# Patient Record
Sex: Male | Born: 1955 | Hispanic: Yes | Marital: Married | Smoking: Never smoker
Health system: Southern US, Community
[De-identification: ages and names within clinical notes are randomized; demographics above are authoritative.]

## PROBLEM LIST (undated history)

## (undated) DIAGNOSIS — M545 Low back pain, unspecified: Secondary | ICD-10-CM

## (undated) DIAGNOSIS — G8929 Other chronic pain: Secondary | ICD-10-CM

## (undated) DIAGNOSIS — I10 Essential (primary) hypertension: Secondary | ICD-10-CM

## (undated) DIAGNOSIS — M1990 Unspecified osteoarthritis, unspecified site: Secondary | ICD-10-CM

## (undated) HISTORY — PX: NO PAST SURGERIES: SHX2092

---

## 2006-07-06 ENCOUNTER — Ambulatory Visit: Payer: Self-pay

## 2007-06-04 ENCOUNTER — Ambulatory Visit: Payer: Self-pay | Admitting: Pain Medicine

## 2007-06-22 ENCOUNTER — Ambulatory Visit: Payer: Self-pay | Admitting: Pain Medicine

## 2007-06-25 ENCOUNTER — Ambulatory Visit: Payer: Self-pay | Admitting: Pain Medicine

## 2007-09-03 ENCOUNTER — Ambulatory Visit: Payer: Self-pay | Admitting: Pain Medicine

## 2007-09-13 ENCOUNTER — Ambulatory Visit: Payer: Self-pay | Admitting: Pain Medicine

## 2007-10-15 ENCOUNTER — Ambulatory Visit: Payer: Self-pay | Admitting: Pain Medicine

## 2007-10-23 ENCOUNTER — Ambulatory Visit: Payer: Self-pay | Admitting: Pain Medicine

## 2007-11-07 ENCOUNTER — Ambulatory Visit: Payer: Self-pay | Admitting: Pain Medicine

## 2007-11-13 ENCOUNTER — Ambulatory Visit: Payer: Self-pay | Admitting: Pain Medicine

## 2007-11-28 ENCOUNTER — Ambulatory Visit: Payer: Self-pay | Admitting: Pain Medicine

## 2007-12-19 ENCOUNTER — Ambulatory Visit: Payer: Self-pay | Admitting: Pain Medicine

## 2007-12-31 ENCOUNTER — Ambulatory Visit: Payer: Self-pay | Admitting: Pain Medicine

## 2008-01-30 ENCOUNTER — Ambulatory Visit: Payer: Self-pay | Admitting: Pain Medicine

## 2008-03-10 ENCOUNTER — Ambulatory Visit: Payer: Self-pay | Admitting: Pain Medicine

## 2008-03-19 ENCOUNTER — Ambulatory Visit: Payer: Self-pay | Admitting: Pain Medicine

## 2008-04-07 ENCOUNTER — Ambulatory Visit: Payer: Self-pay | Admitting: Pain Medicine

## 2008-05-07 ENCOUNTER — Ambulatory Visit: Payer: Self-pay | Admitting: Physician Assistant

## 2008-06-05 ENCOUNTER — Ambulatory Visit: Payer: Self-pay | Admitting: Physician Assistant

## 2008-07-02 ENCOUNTER — Ambulatory Visit: Payer: Self-pay | Admitting: Pain Medicine

## 2008-09-10 ENCOUNTER — Ambulatory Visit: Payer: Self-pay | Admitting: Pain Medicine

## 2008-09-16 ENCOUNTER — Ambulatory Visit: Payer: Self-pay | Admitting: Pain Medicine

## 2008-10-06 ENCOUNTER — Ambulatory Visit: Payer: Self-pay | Admitting: Pain Medicine

## 2008-10-30 ENCOUNTER — Emergency Department: Payer: Self-pay | Admitting: Emergency Medicine

## 2008-12-29 ENCOUNTER — Ambulatory Visit: Payer: Self-pay | Admitting: Pain Medicine

## 2009-03-30 ENCOUNTER — Ambulatory Visit: Payer: Self-pay | Admitting: Pain Medicine

## 2009-06-29 ENCOUNTER — Ambulatory Visit: Payer: Self-pay | Admitting: Pain Medicine

## 2009-09-21 ENCOUNTER — Ambulatory Visit: Payer: Self-pay | Admitting: Pain Medicine

## 2009-12-12 IMAGING — CT CT ABD-PELV W/ CM
1 of 3 series · 12 of 32 positions shown, 18 images · non-contrast
Comparison: none

REASON FOR EXAM: (1) severe epigastric and umbilical pain with fever; (2)
tenderness and fever
COMMENTS:   LMP: (Male)

[Series 2: abdomen · axial · 0.87mm/px · z∈[-40,+410]mm · 12 of 106 slices shown, 18 images]
[im 8/106  soft-tissue]
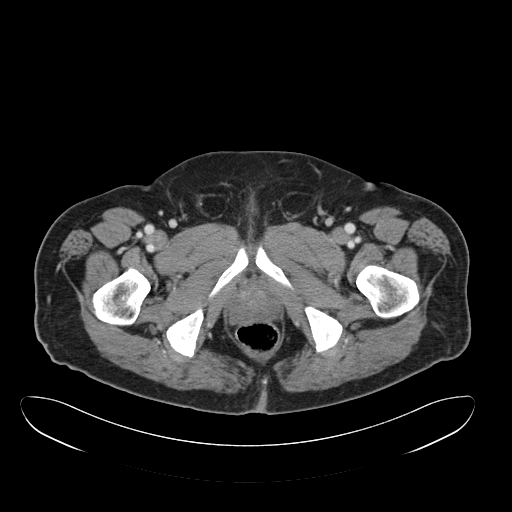
[im 8/106  bone]
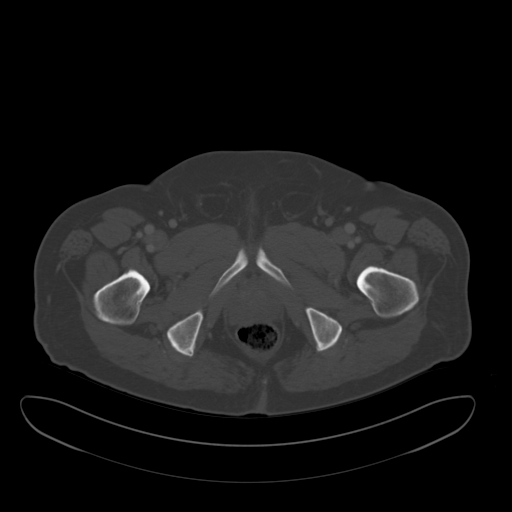
[im 16/106  soft-tissue]
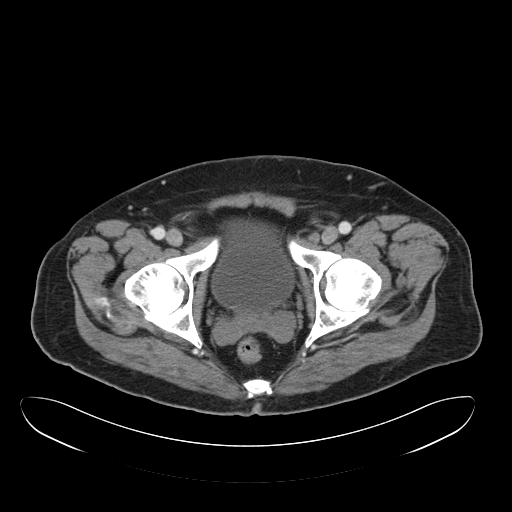
[im 23/106  soft-tissue]
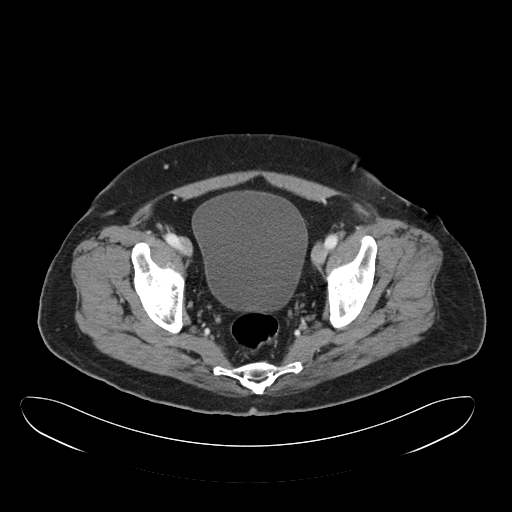
[im 31/106  soft-tissue]
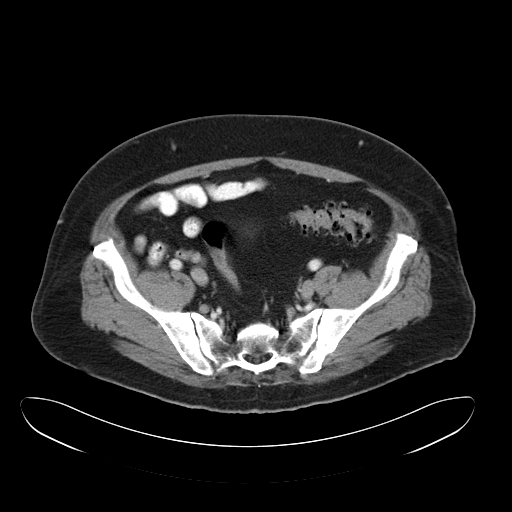
[im 38/106  soft-tissue]
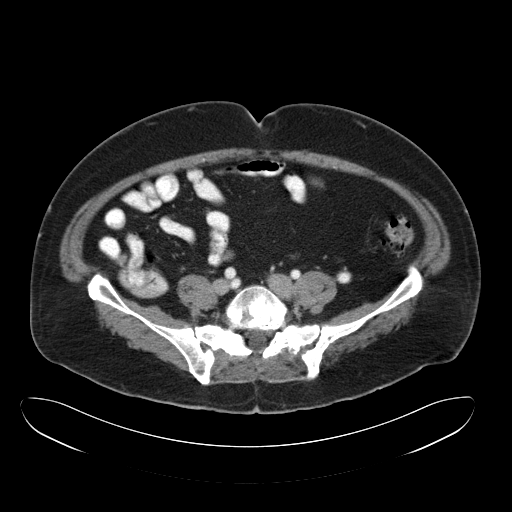
[im 46/106  soft-tissue]
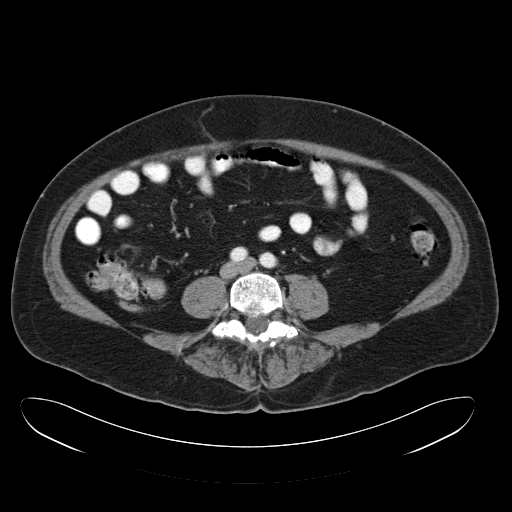
[im 61/106  soft-tissue]
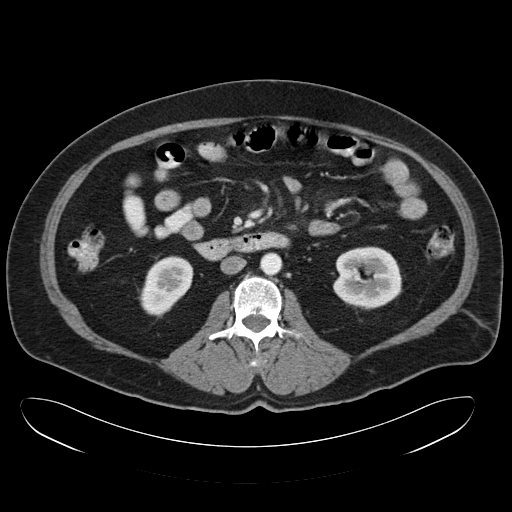
[im 68/106  soft-tissue]
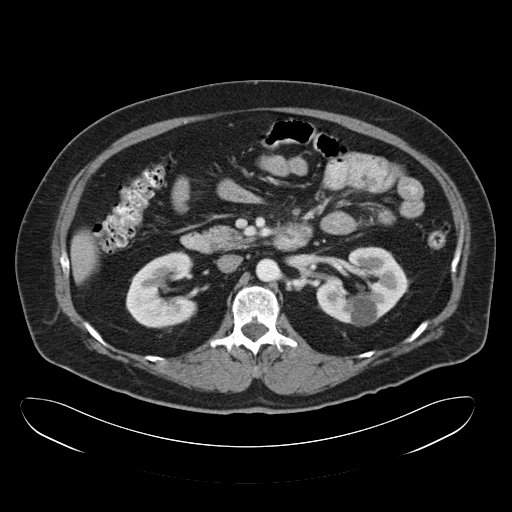
[im 76/106  soft-tissue]
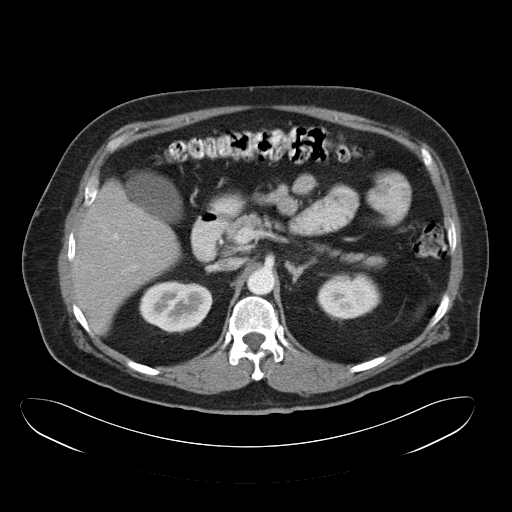
[im 76/106  lung]
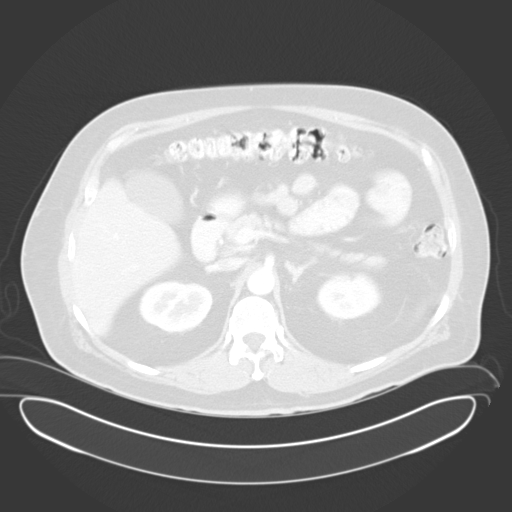
[im 76/106  bone]
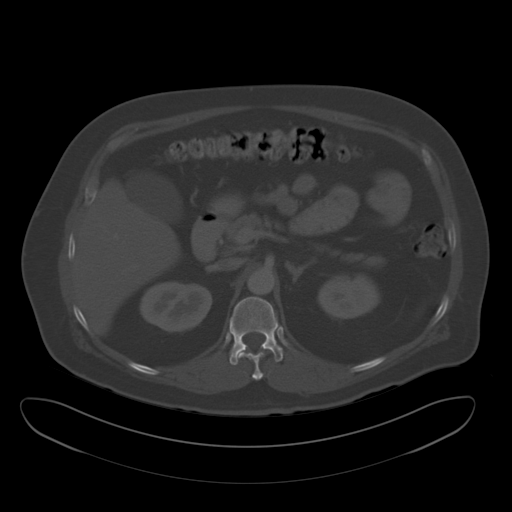
[im 83/106  soft-tissue]
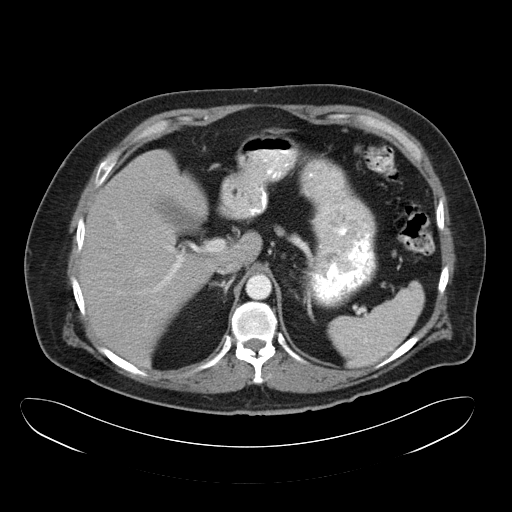
[im 83/106  lung]
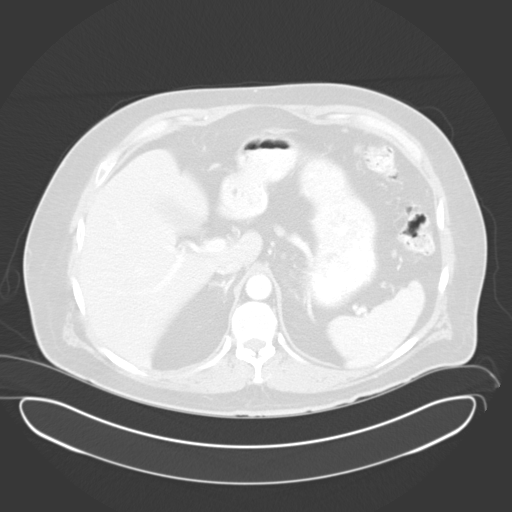
[im 91/106  soft-tissue]
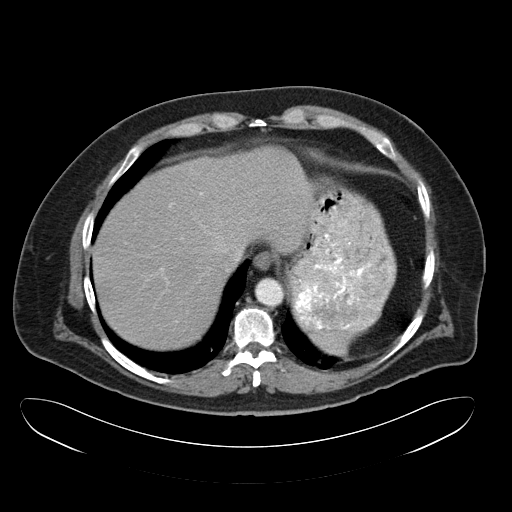
[im 91/106  lung]
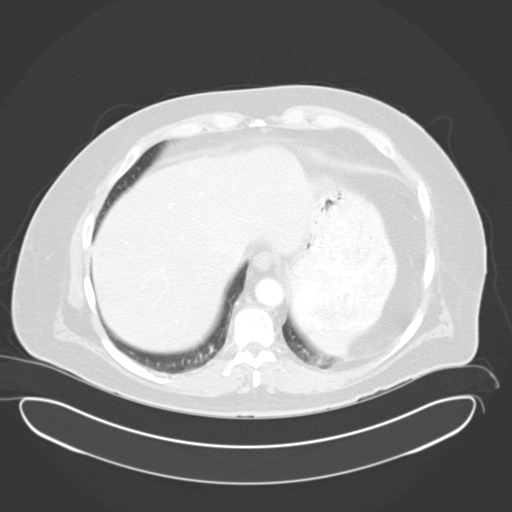
[im 98/106  soft-tissue]
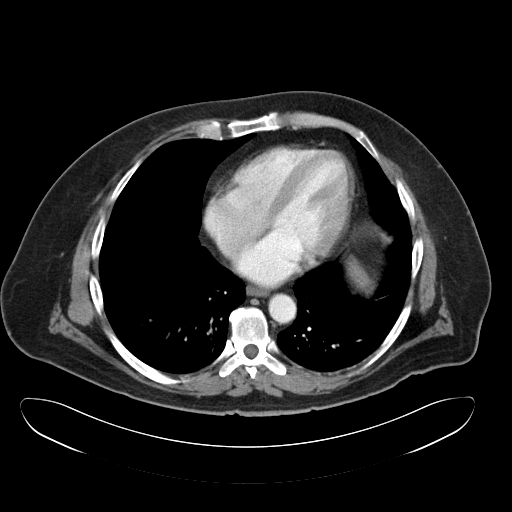
[im 98/106  lung]
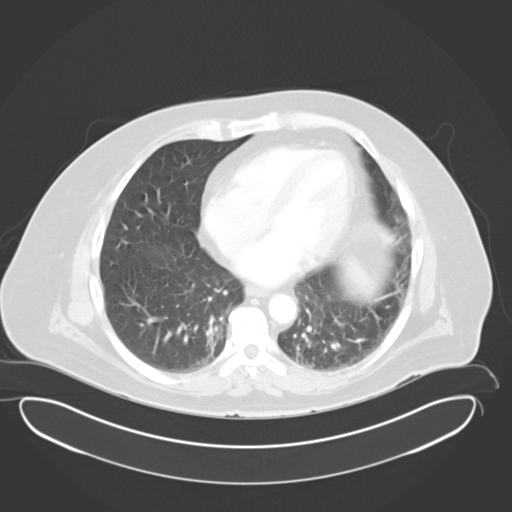

[12 of 32 positions shown; findings below may reference images not displayed]

PROCEDURE:     CT  - CT ABDOMEN / PELVIS  W  - October 31, 2008  [DATE]

RESULT:     Axial CT scanning was performed through the abdomen and pelvis
at 5 mm intervals and slice thicknesses following administration of 100 cc
of Lsovue-BFV as well as oral contrast material. Review of 3-dimensional
reconstructed images was performed separately on the [employer]
monitor. Immediate and delayed images were obtained.

The stomach is moderately distended with food and a small amount of oral
contrast. The small and large bowel contain a moderate amount of the orally
administered contrast but the contrast has not reached the left colon. There
is sigmoid diverticulosis. In the appropriate clinical setting there could
be an element of low-grade diverticulitis. There is no evidence of
obstruction or diverticular abscess or perforation. The urinary bladder is
partially distended and grossly normal. The prostate gland is mildly
enlarged and produces an impression upon the urinary bladder base.

The liver, gallbladder, pancreas, adrenal glands, and spleen are normal in
appearance. There is approximately 2 by 3 cm diameter hypodensity in the
midpole of the left kidney posteriorly most compatible with a benign cyst.
The caliber of the abdominal aorta is normal. The appendix is demonstrated
and it is normal in appearance. The lung bases exhibit very minimal
subsegmental atelectasis posterior medially in the lower lobes. The lumbar
vertebral bodies are preserved in height.
IMPRESSION: 1. There is sigmoid diverticulosis. I cannot exclude low-grade
diverticulitis in the appropriate clinical setting but no significant
perisigmoid inflammatory changes are seen. There is no evidence of an
abscess or perforation.
2. There is no evidence of abnormality elsewhere involving the bowel. The
appendix is demonstrated and appears normal.
3. I see no acute hepatobiliary abnormality nor acute urinary tract
abnormality. A cyst in the left kidney is suspected there is mild
enlargement of the prostate gland.

A preliminary report was sent to the [HOSPITAL] the conclusion
of the study.

## 2009-12-12 IMAGING — US ABDOMEN ULTRASOUND
1 series · 17 of 25 positions shown · non-contrast
Comparison: none

REASON FOR EXAM: epigastric umbilical and right upper quadrant pain
COMMENTS:

[Series 1: abdomen ultrasound · 17 of 57 slices shown]
[im 1/57]
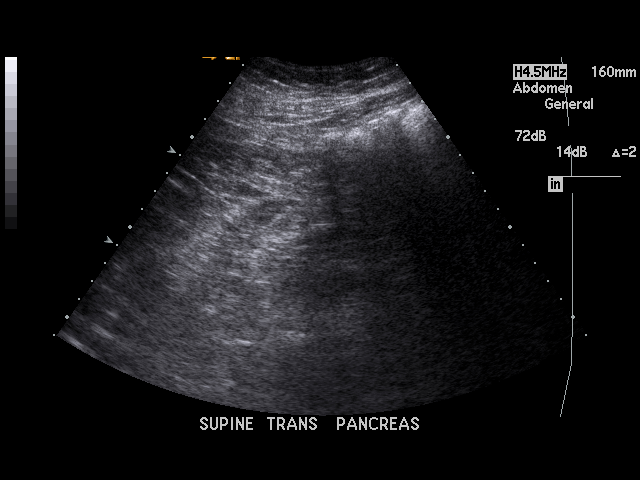
[im 5/57]
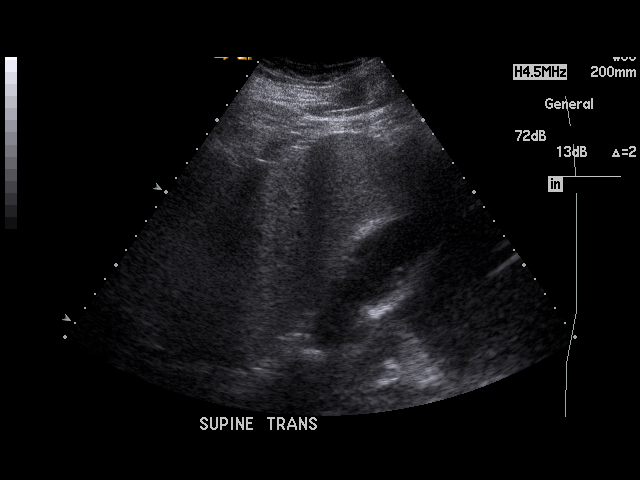
[im 8/57]
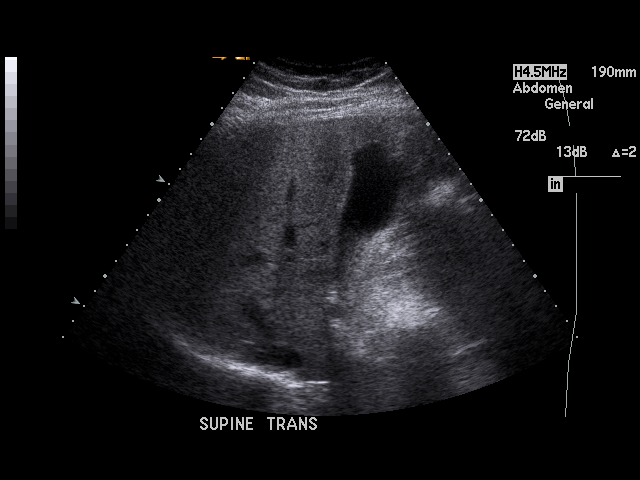
[im 12/57]
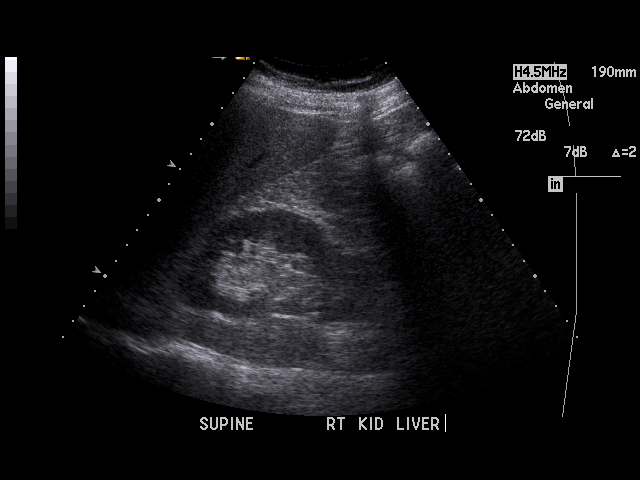
[im 15/57]
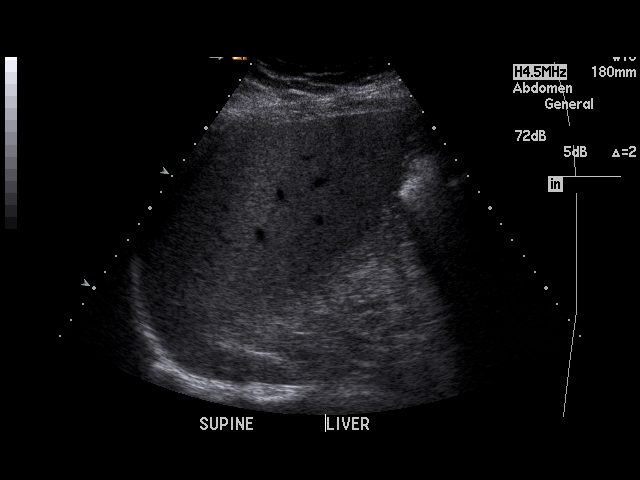
[im 19/57]
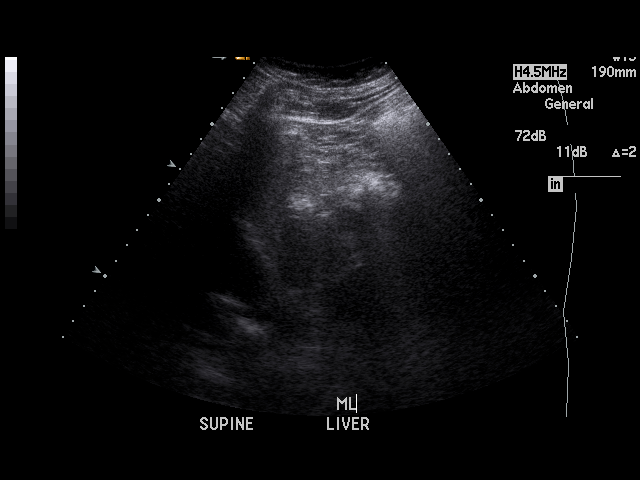
[im 22/57]
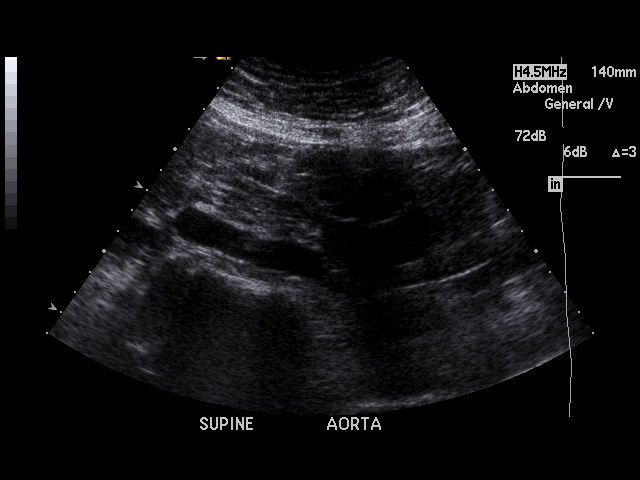
[im 26/57]
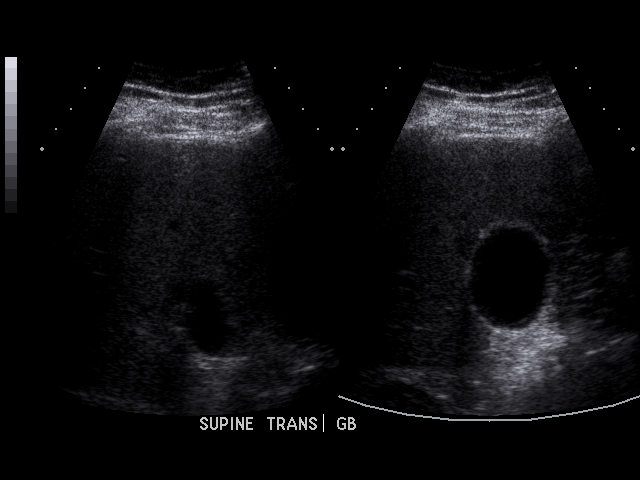
[im 29/57]
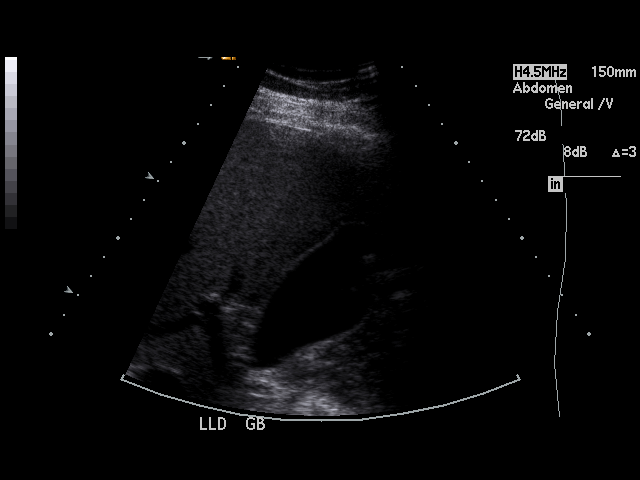
[im 31/57]
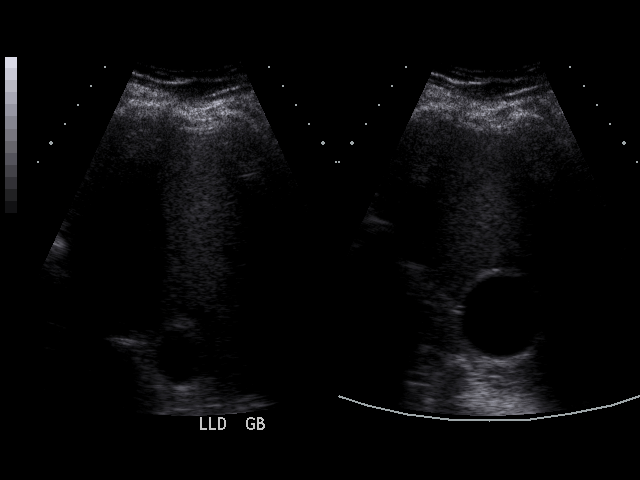
[im 36/57]
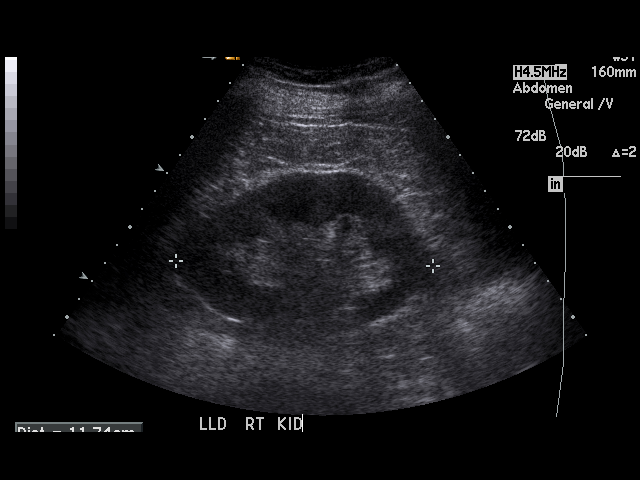
[im 38/57]
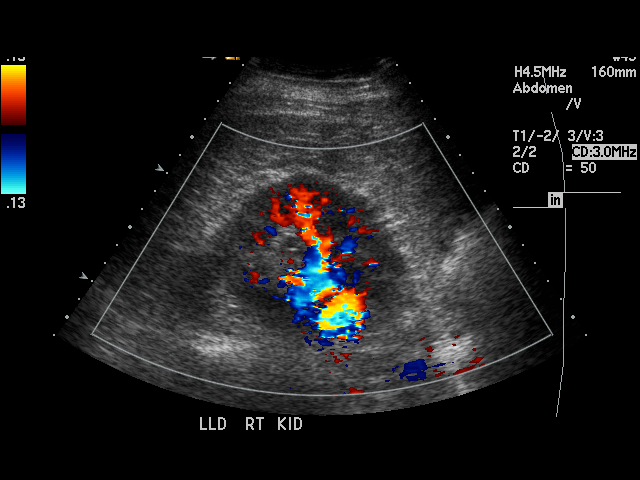
[im 43/57]
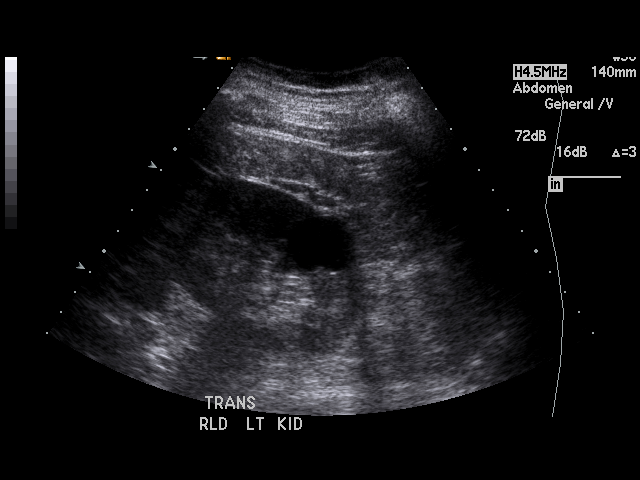
[im 45/57]
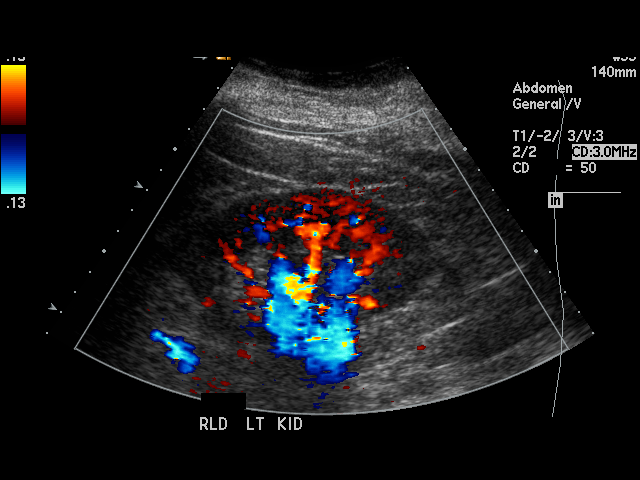
[im 50/57]
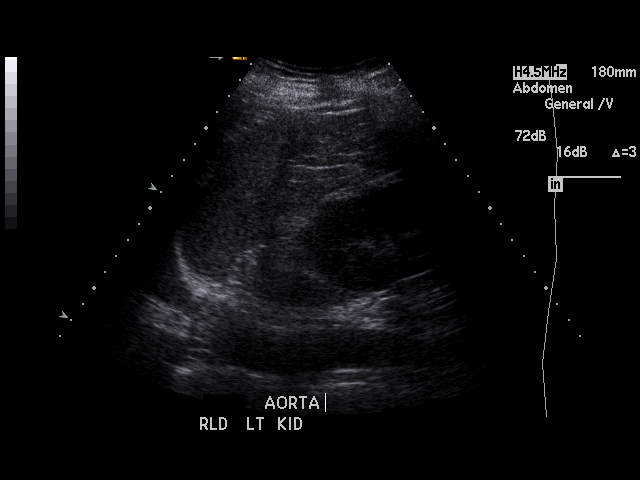
[im 52/57]
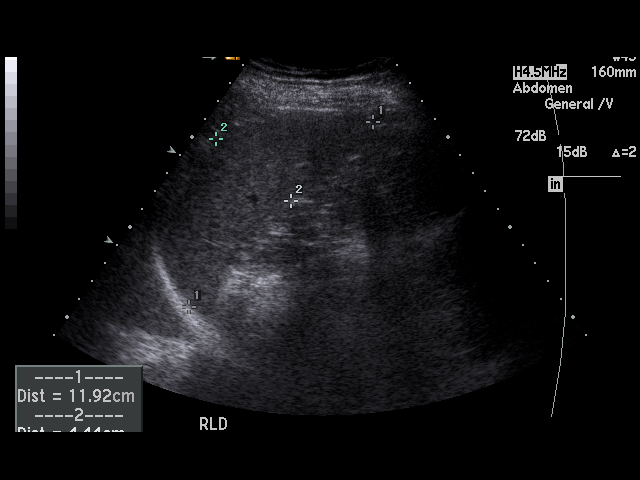
[im 57/57]
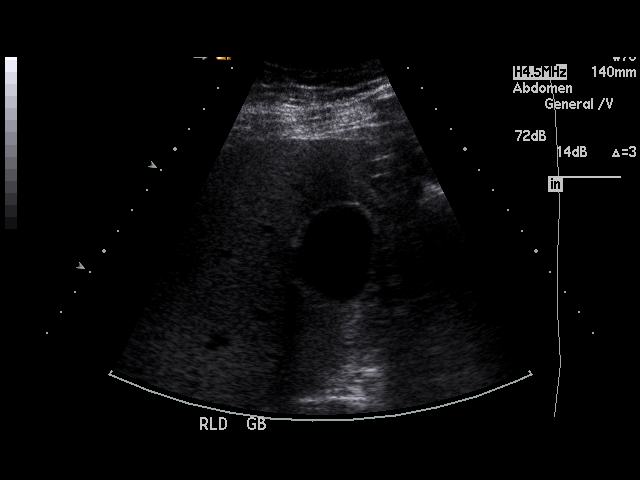

[17 of 25 positions shown; findings below may reference images not displayed]

PROCEDURE:     US  - US ABDOMEN GENERAL SURVEY  - October 31, 2008  [DATE]

RESULT:     The liver exhibits normal echotexture with no evidence of a mass
nor ductal dilation. Portal venous flow is normal in direction toward the
liver. The pancreas could not be demonstrated due to the presence of bowel
gas. The gallbladder is adequately distended with no evidence of stones,
wall thickening, or pericholecystic fluid. There is no sonographic Murphy's
sign elicited but the patient had received pain medication. The common bile
duct measures 3.1 mm in diameter. The spleen, abdominal aorta, and kidneys
exhibit no acute abnormality. In the upper pole of the right correction left
kidney there is an approximately 2.8 cm diameter simple appearing cyst.
IMPRESSION: I do not see acute intra-abdominal abnormality on this
study. Specifically no gallstones are demonstrated. A preliminary report was
sent to the [HOSPITAL] the conclusion of the study.

## 2010-07-30 ENCOUNTER — Emergency Department: Payer: Self-pay | Admitting: Emergency Medicine

## 2018-08-27 ENCOUNTER — Other Ambulatory Visit: Payer: Self-pay

## 2018-08-27 DIAGNOSIS — M25562 Pain in left knee: Secondary | ICD-10-CM | POA: Diagnosis not present

## 2018-08-27 NOTE — ED Triage Notes (Signed)
Pt in with co left knee pain hx of arthritis, has had injections to same knee in the past. Denies any new injury.

## 2018-08-28 ENCOUNTER — Encounter: Payer: Self-pay | Admitting: Emergency Medicine

## 2018-08-28 ENCOUNTER — Emergency Department
Admission: EM | Admit: 2018-08-28 | Discharge: 2018-08-28 | Disposition: A | Discharge status: Home / Self Care | Payer: Medicaid Other | Attending: Emergency Medicine | Admitting: Emergency Medicine

## 2018-08-28 DIAGNOSIS — M25562 Pain in left knee: Secondary | ICD-10-CM

## 2018-08-28 HISTORY — DX: Other chronic pain: G89.29

## 2018-08-28 HISTORY — DX: Unspecified osteoarthritis, unspecified site: M19.90

## 2018-08-28 HISTORY — DX: Low back pain: M54.5

## 2018-08-28 HISTORY — DX: Low back pain, unspecified: M54.50

## 2018-08-28 MED ORDER — LIDOCAINE 5 % EX PTCH
1.0000 | MEDICATED_PATCH | CUTANEOUS | Status: DC
  Administered 2018-08-28: 1 via TRANSDERMAL
  Filled 2018-08-28: qty 1

## 2018-08-28 MED ORDER — LIDOCAINE 5 % EX PTCH: 1.0000 | MEDICATED_PATCH | Freq: Two times a day (BID) | CUTANEOUS | 0 refills | Status: AC

## 2018-08-28 NOTE — ED Provider Notes (Signed)
[facility] Emergency Department Provider Note  ____________________________________________   First MD Initiated Contact with Patient 08/28/18 0123     (approximate)  I have reviewed the triage vital signs and the nursing notes.   HISTORY  Chief Complaint Knee Pain  The patient and/or family speak(s) Spanish.  They understand they have the right to the use of a hospital interpreter, however at this time they prefer to speak directly with me in Spanish.  They know that they can ask for an interpreter at any time.  His wife also speaks [place] and they prefer to interact with me directly rather than through a teleinterpreter.   HPI Bryan Novak is a 63 y.o. male with chronic pain as listed below who presents for evaluation of gradually worsening pain in his left knee for the last 3 weeks.  He reports that he has a lot of nerve pain in his back but also down his leg and around the knee.  He says that he had a fall about 3 weeks ago and he is sure that he did not break anything but the pain is gradually gotten worse.  He is ambulatory but it hurts to do so and he has trouble bearing weight.  There is no swelling or redness.  He says that he needs something for the pain and preferably an injection because he had a similar problem years ago and his knee and felt much better after getting an injection.  He does not have an orthopedic doctor and has not seen an orthopedic doctor since the subacute injury.  He sees a pain management doctor at [facility] regularly and has been taking his medications.  Nothing in particular makes these symptoms better and weightbearing makes it worse.  He has chronic pain in his lumbar spine radiating down his legs and that has not changed.  Past Medical History:  Diagnosis Date  . Arthritis   . Chronic lumbar pain    sees [facility]    There are no active problems to display for this patient.   History reviewed. No pertinent  surgical history.  Prior to Admission medications   Medication Sig Start Date End Date Taking? Authorizing Provider  lidocaine (LIDODERM) 5 % Place 1 patch onto the skin every 12 (twelve) hours. Remove & Discard patch within 12 hours or as directed by MD.  [name] the patch off for 12 hours before applying a new one. 08/28/18 08/28/19  Loleta Rose, MD    Allergies Patient has no allergy information on record.  History reviewed. No pertinent family history.  Social History Social History   Tobacco Use  . Smoking status: Never Smoker  . Smokeless tobacco: Never Used  Substance Use Topics  . Alcohol use: Not on file  . Drug use: Not on file    Review of Systems Constitutional: No fever/chills Cardiovascular: Denies chest pain. Respiratory: Denies shortness of breath. Gastrointestinal: No abdominal pain.  No nausea, no vomiting.   Musculoskeletal: Chronic low back pain.  Worsening pain for the last 3 weeks in the left knee but with a history of bilateral chronic knee pain secondary to osteoarthritis. Integumentary: Negative for rash, redness, nor swelling. Neurological: Negative for headaches, focal weakness or numbness.   ____________________________________________   PHYSICAL EXAM:  VITAL SIGNS: ED Triage Vitals [08/27/18 2337]  Enc Vitals Group     BP (!) 149/89     Pulse Rate 72     Resp 20     Temp  97.6 F (36.4 C)     Temp Source Oral     SpO2 97 %     Weight 97.1 kg (214 lb)     Height      Head Circumference      Peak Flow      Pain Score 10     Pain Loc      Pain Edu?      Excl. in GC?     Constitutional: Alert and oriented.  Generally well-appearing and not in distress. Eyes: Conjunctivae are normal.  Head: Atraumatic. Cardiovascular: Normal rate, regular rhythm. Good peripheral circulation. Respiratory: Normal respiratory effort.  No retractions.  Musculoskeletal: No knee effusion on either side.  Normal appearance of the left knee.  No joint laxity  with Lachman test.  No reported pain or tenderness with flexion and extension of the knee.  The patient does have some tenderness to palpation of the lateral side of the knee but without any palpable effusion or swelling. Neurologic:  Normal speech and language. No gross focal neurologic deficits are appreciated.  Skin:  Skin is warm, dry and intact. No rash noted including no erythema of the left knee. Psychiatric: Mood and affect are normal. Speech and behavior are normal.  ____________________________________________   LABS (all labs ordered are listed, but only abnormal results are displayed)  Labs Reviewed - No data to display ____________________________________________  EKG  No indication for EKG ____________________________________________  RADIOLOGY   ED MD interpretation: Patient refuses radiographs.  Official radiology report(s): No results found.  ____________________________________________   PROCEDURES  Critical Care performed: No   Procedure(s) performed:   Procedures   ____________________________________________   INITIAL IMPRESSION / ASSESSMENT AND PLAN / ED COURSE  As part of my medical decision making, I reviewed the following data within the electronic MEDICAL RECORD NUMBER History obtained from family, Nursing notes reviewed and incorporated, Old chart reviewed, Notes from prior ED visits and [place] Controlled Substance Database    The patient has a history of osteoarthritis which I suspect is the main cause of the pain in his knee.  He has had pain in the left knee for 3 weeks and reports it is getting worse.  There is no physical abnormality appreciated on exam and he has some tenderness to the lateral aspect which could indicate a ligamentous injury that is subacute at this point.  The patient is very insistent that he wants an injection in his knee, but I explained several times that not only do I not perform that procedure in the emergency department,  I am not certain that that it would be beneficial to him given the possibility of a ligamentous injury.  I also explained that I strongly feel that given that this is not a procedure with which I am comfortable, we do not have a specific diagnosis, and that injecting medication such as lidocaine or steroids could actually worsen his condition.  He is unhappy with this but he and his wife say that they understand.  I explained that they need to follow-up with orthopedics but I also provided information for Dr. Yves Dill with physiatry who may be able to assist.  I repeatedly offered to obtain plain radiographs to make sure there is no bony injury but the patient refuses.  I am providing a knee immobilizer for stabilization and comfort as well as a Lidoderm patch and to the nurses instructed him and his spouse regarding the use (12 hours on and 12 hours off).  I  encouraged him to use his regularly prescribed narcotics and to follow-up with his doctor and orthopedics or physiatry as soon as possible.  They state they understand and agree.  There is no evidence at all that this represents a septic joint or other emergent medical condition.     ____________________________________________  FINAL CLINICAL IMPRESSION(S) / ED DIAGNOSES  Final diagnoses:  Left knee pain, unspecified chronicity     MEDICATIONS GIVEN DURING THIS VISIT:  Medications  lidocaine (LIDODERM) 5 % 1 patch (1 patch Transdermal Patch Applied 08/28/18 0144)     ED Discharge Orders         Ordered    lidocaine (LIDODERM) 5 %  Every 12 hours     08/28/18 0149           Note:  This document was prepared using Dragon voice recognition software and may include unintentional dictation errors.   Loleta RoseForbach, Bridey Brookover, MD 08/28/18 [phone]

## 2018-08-28 NOTE — Discharge Instructions (Addendum)
It is important that you follow-up with orthopedics or Dr. Yves Dill who is another kind of doctor that can help with joint and musculoskeletal injections.  Please call either 1 of the doctors listed to schedule the next available follow-up appointment.  Orthopedics clinic also has a walk-in clinic where an appointment is not necessary and you can go directly there during business hours tomorrow.  Use the prescribed Lidoderm patch as needed but remember to remove it after 12 hours and give yourself 12 hours without the patch.  Use the knee immobilizer for stability and comfort.  Read through the included information for additional management recommendations.

## 2020-10-26 ENCOUNTER — Other Ambulatory Visit: Payer: Self-pay

## 2020-10-26 ENCOUNTER — Encounter: Payer: Self-pay | Admitting: Ophthalmology

## 2020-10-27 ENCOUNTER — Other Ambulatory Visit: Admission: RE | Admit: 2020-10-27 | Payer: 59 | Source: Ambulatory Visit

## 2020-10-27 NOTE — Discharge Instructions (Signed)

## 2020-10-29 ENCOUNTER — Ambulatory Visit
Admission: RE | Admit: 2020-10-29 | Discharge: 2020-10-29 | Disposition: A | Discharge status: Home / Self Care | Payer: 59 | Attending: Ophthalmology | Admitting: Ophthalmology

## 2020-10-29 ENCOUNTER — Encounter
Admission: RE | Disposition: A | Discharge status: Home / Self Care | Payer: Self-pay | Source: Home / Self Care | Attending: Ophthalmology

## 2020-10-29 ENCOUNTER — Other Ambulatory Visit: Payer: Self-pay

## 2020-10-29 ENCOUNTER — Encounter: Payer: Self-pay | Admitting: Ophthalmology

## 2020-10-29 ENCOUNTER — Ambulatory Visit: Payer: 59 | Admitting: Anesthesiology

## 2020-10-29 DIAGNOSIS — H2511 Age-related nuclear cataract, right eye: Secondary | ICD-10-CM | POA: Diagnosis present

## 2020-10-29 DIAGNOSIS — Z79899 Other long term (current) drug therapy: Secondary | ICD-10-CM | POA: Diagnosis not present

## 2020-10-29 HISTORY — PX: CATARACT EXTRACTION W/PHACO: SHX586

## 2020-10-29 HISTORY — DX: Essential (primary) hypertension: I10

## 2020-10-29 HISTORY — DX: Other chronic pain: G89.29

## 2020-10-29 SURGERY — PHACOEMULSIFICATION, CATARACT, WITH IOL INSERTION
Anesthesia: Monitor Anesthesia Care | Site: Eye | Laterality: Right

## 2020-10-29 MED ORDER — MOXIFLOXACIN HCL 0.5 % OP SOLN
OPHTHALMIC | Status: DC | PRN
  Administered 2020-10-29: 0.2 mL via OPHTHALMIC

## 2020-10-29 MED ORDER — FENTANYL CITRATE (PF) 100 MCG/2ML IJ SOLN
INTRAMUSCULAR | Status: DC | PRN
  Administered 2020-10-29: 50 ug via INTRAVENOUS

## 2020-10-29 MED ORDER — EPINEPHRINE PF 1 MG/ML IJ SOLN
INTRAOCULAR | Status: DC | PRN
  Administered 2020-10-29: 54 mL via OPHTHALMIC

## 2020-10-29 MED ORDER — MIDAZOLAM HCL 2 MG/2ML IJ SOLN
INTRAMUSCULAR | Status: DC | PRN
  Administered 2020-10-29: 1 mg via INTRAVENOUS

## 2020-10-29 MED ORDER — BRIMONIDINE TARTRATE-TIMOLOL 0.2-0.5 % OP SOLN
OPHTHALMIC | Status: DC | PRN
  Administered 2020-10-29: 1 [drp] via OPHTHALMIC

## 2020-10-29 MED ORDER — LIDOCAINE HCL (PF) 2 % IJ SOLN
INTRAOCULAR | Status: DC | PRN
  Administered 2020-10-29: 1 mL

## 2020-10-29 MED ORDER — ARMC OPHTHALMIC DILATING DROPS
1.0000 "application " | OPHTHALMIC | Status: DC | PRN
  Administered 2020-10-29 (×3): 1 via OPHTHALMIC

## 2020-10-29 MED ORDER — NA CHONDROIT SULF-NA HYALURON 40-17 MG/ML IO SOLN
INTRAOCULAR | Status: DC | PRN
  Administered 2020-10-29: 1 mL via INTRAOCULAR

## 2020-10-29 MED ORDER — TETRACAINE HCL 0.5 % OP SOLN
1.0000 [drp] | OPHTHALMIC | Status: DC | PRN
  Administered 2020-10-29 (×3): 1 [drp] via OPHTHALMIC

## 2020-10-29 SURGICAL SUPPLY — 15 items
CANNULA ANT/CHMB 27GA (MISCELLANEOUS) ×4 IMPLANT
GLOVE SURG TRIUMPH 8.0 PF LTX (GLOVE) ×2 IMPLANT
GOWN STRL REUS W/ TWL LRG LVL3 (GOWN DISPOSABLE) ×2 IMPLANT
GOWN STRL REUS W/TWL LRG LVL3 (GOWN DISPOSABLE) ×4
LENS IOL TECNIS EYHANCE 21.5 (Intraocular Lens) ×2 IMPLANT
MARKER SKIN DUAL TIP RULER LAB (MISCELLANEOUS) ×2 IMPLANT
NEEDLE FILTER BLUNT 18X 1/2SAF (NEEDLE) ×1
NEEDLE FILTER BLUNT 18X1 1/2 (NEEDLE) ×1 IMPLANT
PACK EYE AFTER SURG (MISCELLANEOUS) ×2 IMPLANT
PACK OPTHALMIC (MISCELLANEOUS) ×2 IMPLANT
PACK PORFILIO (MISCELLANEOUS) ×2 IMPLANT
SYR 3ML LL SCALE MARK (SYRINGE) ×2 IMPLANT
SYR TB 1ML LUER SLIP (SYRINGE) ×2 IMPLANT
WATER STERILE IRR 250ML POUR (IV SOLUTION) ×2 IMPLANT
WIPE NON LINTING 3.25X3.25 (MISCELLANEOUS) ×2 IMPLANT

## 2020-10-29 NOTE — Anesthesia Procedure Notes (Signed)
Procedure Name: MAC Date/Time: 10/29/2020 9:43 AM Performed by: Cameron Ali, CRNA Pre-anesthesia Checklist: Patient identified, Emergency Drugs available, Suction available, Timeout performed and Patient being monitored Patient Re-evaluated:Patient Re-evaluated prior to induction Oxygen Delivery Method: Nasal cannula Placement Confirmation: positive ETCO2

## 2020-10-29 NOTE — Transfer of Care (Signed)
Immediate Anesthesia Transfer of Care Note  Patient: Bryan Novak  Procedure(s) Performed: CATARACT EXTRACTION PHACO AND INTRAOCULAR LENS PLACEMENT (IOC) RIGHT 13.59 01:04.2 (Right Eye)  Patient Location: PACU  Anesthesia Type: MAC  Level of Consciousness: awake, alert  and patient cooperative  Airway and Oxygen Therapy: Patient Spontanous Breathing and Patient connected to supplemental oxygen  Post-op Assessment: Post-op Vital signs reviewed, Patient's Cardiovascular Status Stable, Respiratory Function Stable, Patent Airway and No signs of Nausea or vomiting  Post-op Vital Signs: Reviewed and stable  Complications: No complications documented.

## 2020-10-29 NOTE — H&P (Signed)
[facility]   Primary Care Physician:  [facility] Ophthalmologist: Dr. Druscilla Brownie  Pre-Procedure History & Physical: HPI:  Bryan Novak is a 65 y.o. male here for cataract surgery.   Past Medical History:  Diagnosis Date  . Arthritis   . Chronic back pain   . Chronic lumbar pain    sees [facility]  . Hypertension     Past Surgical History:  Procedure Laterality Date  . NO PAST SURGERIES      Prior to Admission medications   Medication Sig Start Date End Date Taking? Authorizing Provider  acetaminophen (TYLENOL) 500 MG tablet Take 1,000 mg by mouth every 6 (six) hours as needed.   Yes [provider]  cetirizine (ZYRTEC) 10 MG tablet Take 10 mg by mouth daily.   Yes [provider]  cholecalciferol (VITAMIN D) 25 MCG (1000 UNIT) tablet Take 1,000 Units by mouth daily.   Yes [provider]  lisinopril-hydrochlorothiazide (ZESTORETIC) 20-25 MG tablet Take 1 tablet by mouth daily.   Yes [provider]  naproxen sodium (ALEVE) 220 MG tablet Take 440 mg by mouth 2 (two) times daily as needed.   Yes [provider]  Oxycodone HCl 10 MG TABS Take by mouth every 4 (four) hours as needed.   Yes [provider]  polyethylene glycol (MIRALAX / GLYCOLAX) 17 g packet Take 17 g by mouth daily.   Yes [provider]  traMADol (ULTRAM) 50 MG tablet Take by mouth every 4 (four) hours as needed.   Yes [provider]  vitamin B-12 (CYANOCOBALAMIN) 1000 MCG tablet Take 1,000 mcg by mouth daily.   Yes [provider]  vitamin E 180 MG (400 UNITS) capsule Take 400 Units by mouth daily.   Yes [provider]    Allergies as of 09/28/2020  . (Not on File)    History reviewed. No pertinent family history.  Social History   Socioeconomic History  . Marital status: Married    Spouse name: Not on file  . Number of children: Not on file  . Years of education: Not on file   . Highest education level: Not on file  Occupational History  . Not on file  Tobacco Use  . Smoking status: Never Smoker  . Smokeless tobacco: Never Used  Vaping Use  . Vaping Use: Never used  Substance and Sexual Activity  . Alcohol use: Not Currently  . Drug use: Not on file  . Sexual activity: Not on file  Other Topics Concern  . Not on file  Social History Narrative  . Not on file   Social Determinants of Health   Financial Resource Strain: Not on file  Food Insecurity: Not on file  Transportation Needs: Not on file  Physical Activity: Not on file  Stress: Not on file  Social Connections: Not on file  Intimate Partner Violence: Not on file    Review of Systems: See HPI, otherwise negative ROS  Physical Exam: BP (!) 175/94   Pulse 75   Temp 97.9 F (36.6 C) (Temporal)   Resp 16   Ht 5\' 10"  (1.778 m)   Wt 97.1 kg   SpO2 97%   BMI 30.71 kg/m  General:   Alert,  pleasant and cooperative in NAD Head:  Normocephalic and atraumatic. Respiratory:  Normal work of breathing. Cardiovascular:  RRR  Impression/Plan: Bryan Novak is here for cataract surgery.  Risks, benefits, limitations, and alternatives regarding cataract surgery have been reviewed  with the patient.  Questions have been answered.  All parties agreeable.   Galen Manila, MD  10/29/2020, 9:34 AM

## 2020-10-29 NOTE — Anesthesia Preprocedure Evaluation (Signed)
Anesthesia Evaluation  Patient identified by MRN, date of birth, ID band Patient awake    Airway Mallampati: III  TM Distance: >3 FB Neck ROM: Limited    Dental   Pulmonary neg pulmonary ROS,    Pulmonary exam normal        Cardiovascular hypertension, Normal cardiovascular exam     Neuro/Psych    GI/Hepatic negative GI ROS,   Endo/Other    Renal/GU      Musculoskeletal  (+) Arthritis ,   Abdominal   Peds  Hematology   Anesthesia Other Findings   Reproductive/Obstetrics                             Anesthesia Physical Anesthesia Plan  ASA: II  Anesthesia Plan: MAC   Post-op Pain Management:    Induction: Intravenous  PONV Risk Score and Plan:   Airway Management Planned: Nasal Cannula  Additional Equipment:   Intra-op Plan:   Post-operative Plan:   Informed Consent: I have reviewed the patients History and Physical, chart, labs and discussed the procedure including the risks, benefits and alternatives for the proposed anesthesia with the patient or authorized representative who has indicated his/her understanding and acceptance.       Plan Discussed with:   Anesthesia Plan Comments:         Anesthesia Quick Evaluation

## 2020-10-29 NOTE — Anesthesia Postprocedure Evaluation (Signed)
Anesthesia Post Note  Patient: Bryan Novak  Procedure(s) Performed: CATARACT EXTRACTION PHACO AND INTRAOCULAR LENS PLACEMENT (IOC) RIGHT 13.59 01:04.2 (Right Eye)     Patient location during evaluation: PACU Anesthesia Type: MAC Level of consciousness: awake and alert Pain management: pain level controlled Vital Signs Assessment: post-procedure vital signs reviewed and stable Respiratory status: spontaneous breathing, nonlabored ventilation and respiratory function stable Cardiovascular status: stable and blood pressure returned to baseline Postop Assessment: no apparent nausea or vomiting Anesthetic complications: no   No complications documented.  Wanda Plump Devin Foskey

## 2020-10-29 NOTE — Op Note (Signed)
PREOPERATIVE DIAGNOSIS:  Nuclear sclerotic cataract of the right eye.   POSTOPERATIVE DIAGNOSIS:  H25.89 Cataract   OPERATIVE PROCEDURE:@   SURGEON:  Galen Manila, MD.   ANESTHESIA:  Anesthesiologist: Jarome Matin, MD CRNA: Maree Krabbe, CRNA  1.      Managed anesthesia care. 2.      0.55ml of Shugarcaine was instilled in the eye following the paracentesis.   COMPLICATIONS:  None.   TECHNIQUE:   Stop and chop   DESCRIPTION OF PROCEDURE:  The patient was examined and consented in the preoperative holding area where the aforementioned topical anesthesia was applied to the right eye and then brought back to the Operating Room where the right eye was prepped and draped in the usual sterile ophthalmic fashion and a lid speculum was placed. A paracentesis was created with the side port blade and the anterior chamber was filled with viscoelastic. A near clear corneal incision was performed with the steel keratome. A continuous curvilinear capsulorrhexis was performed with a cystotome followed by the capsulorrhexis forceps. Hydrodissection and hydrodelineation were carried out with BSS on a blunt cannula. The lens was removed in a stop and chop  technique and the remaining cortical material was removed with the irrigation-aspiration handpiece. The capsular bag was inflated with viscoelastic and the Technis [id number]  lens was placed in the capsular bag without complication. The remaining viscoelastic was removed from the eye with the irrigation-aspiration handpiece. The wounds were hydrated. The anterior chamber was flushed with BSS and the eye was inflated to physiologic pressure. 0.29ml of Vigamox was placed in the anterior chamber. The wounds were found to be water tight. The eye was dressed with Combigan. The patient was given protective glasses to wear throughout the day and a shield with which to sleep tonight. The patient was also given drops with which to begin a drop regimen today and will  follow-up with me in one day. Implant Name Type Inv. Item Serial No. Manufacturer Lot No. LRB No. Used Action  LENS IOL TECNIS EYHANCE 21.5 - [id number] Intraocular Lens LENS IOL TECNIS EYHANCE 21.5 [id number] JOHNSON   Right 1 Implanted   Procedure(s): CATARACT EXTRACTION PHACO AND INTRAOCULAR LENS PLACEMENT (IOC) RIGHT 13.59 01:04.2 (Right)  Electronically signed: Galen Manila 10/29/2020 9:59 AM

## 2020-10-30 ENCOUNTER — Encounter: Payer: Self-pay | Admitting: Ophthalmology

## 2020-12-08 ENCOUNTER — Encounter: Admission: RE | Payer: Self-pay | Source: Home / Self Care

## 2020-12-08 ENCOUNTER — Ambulatory Visit: Admission: RE | Admit: 2020-12-08 | Payer: 59 | Source: Home / Self Care | Admitting: Ophthalmology

## 2020-12-08 SURGERY — PHACOEMULSIFICATION, CATARACT, WITH IOL INSERTION
Anesthesia: Topical | Laterality: Left
